# Patient Record
Sex: Female | Born: 1955 | Race: White | Hispanic: No | Smoking: Former smoker
Health system: Southern US, Community
[De-identification: ages and names within clinical notes are randomized; demographics above are authoritative.]

## PROBLEM LIST (undated history)

## (undated) DIAGNOSIS — R1011 Right upper quadrant pain: Secondary | ICD-10-CM

## (undated) DIAGNOSIS — Z923 Personal history of irradiation: Secondary | ICD-10-CM

## (undated) DIAGNOSIS — J45909 Unspecified asthma, uncomplicated: Secondary | ICD-10-CM

## (undated) DIAGNOSIS — I10 Essential (primary) hypertension: Secondary | ICD-10-CM

## (undated) DIAGNOSIS — R920 Mammographic microcalcification found on diagnostic imaging of breast: Secondary | ICD-10-CM

## (undated) DIAGNOSIS — Z853 Personal history of malignant neoplasm of breast: Secondary | ICD-10-CM

## (undated) DIAGNOSIS — E669 Obesity, unspecified: Secondary | ICD-10-CM

## (undated) DIAGNOSIS — H409 Unspecified glaucoma: Secondary | ICD-10-CM

## (undated) DIAGNOSIS — Z87891 Personal history of nicotine dependence: Secondary | ICD-10-CM

## (undated) DIAGNOSIS — M1990 Unspecified osteoarthritis, unspecified site: Secondary | ICD-10-CM

## (undated) DIAGNOSIS — Z1211 Encounter for screening for malignant neoplasm of colon: Secondary | ICD-10-CM

## (undated) DIAGNOSIS — G5600 Carpal tunnel syndrome, unspecified upper limb: Secondary | ICD-10-CM

## (undated) DIAGNOSIS — M810 Age-related osteoporosis without current pathological fracture: Secondary | ICD-10-CM

## (undated) DIAGNOSIS — E785 Hyperlipidemia, unspecified: Secondary | ICD-10-CM

## (undated) DIAGNOSIS — C801 Malignant (primary) neoplasm, unspecified: Secondary | ICD-10-CM

## (undated) DIAGNOSIS — N6019 Diffuse cystic mastopathy of unspecified breast: Secondary | ICD-10-CM

## (undated) HISTORY — DX: Carpal tunnel syndrome, unspecified upper limb: G56.00

## (undated) HISTORY — DX: Right upper quadrant pain: R10.11

## (undated) HISTORY — DX: Hyperlipidemia, unspecified: E78.5

## (undated) HISTORY — DX: Personal history of malignant neoplasm of breast: Z85.3

## (undated) HISTORY — DX: Mammographic microcalcification found on diagnostic imaging of breast: R92.0

## (undated) HISTORY — DX: Diffuse cystic mastopathy of unspecified breast: N60.19

## (undated) HISTORY — DX: Essential (primary) hypertension: I10

## (undated) HISTORY — PX: COLONOSCOPY: SHX174

## (undated) HISTORY — DX: Obesity, unspecified: E66.9

## (undated) HISTORY — DX: Unspecified osteoarthritis, unspecified site: M19.90

## (undated) HISTORY — DX: Malignant (primary) neoplasm, unspecified: C80.1

## (undated) HISTORY — DX: Age-related osteoporosis without current pathological fracture: M81.0

## (undated) HISTORY — DX: Personal history of nicotine dependence: Z87.891

## (undated) HISTORY — DX: Unspecified asthma, uncomplicated: J45.909

## (undated) HISTORY — DX: Encounter for screening for malignant neoplasm of colon: Z12.11

## (undated) HISTORY — DX: Unspecified glaucoma: H40.9

---

## 1977-03-27 HISTORY — PX: PILONIDAL CYST EXCISION: SHX744

## 2003-03-28 HISTORY — PX: BREAST CYST EXCISION: SHX579

## 2004-08-05 ENCOUNTER — Ambulatory Visit: Payer: Self-pay | Admitting: Gastroenterology

## 2006-06-05 ENCOUNTER — Ambulatory Visit: Payer: Self-pay | Admitting: General Surgery

## 2006-07-02 ENCOUNTER — Ambulatory Visit: Payer: Self-pay | Admitting: Internal Medicine

## 2006-09-24 ENCOUNTER — Ambulatory Visit: Payer: Self-pay | Admitting: Ophthalmology

## 2007-03-28 HISTORY — PX: UPPER GI ENDOSCOPY: SHX6162

## 2007-06-11 ENCOUNTER — Ambulatory Visit: Payer: Self-pay | Admitting: General Surgery

## 2008-06-04 ENCOUNTER — Ambulatory Visit: Payer: Self-pay | Admitting: Internal Medicine

## 2008-06-11 ENCOUNTER — Ambulatory Visit: Payer: Self-pay | Admitting: General Surgery

## 2009-02-26 ENCOUNTER — Ambulatory Visit: Payer: Self-pay | Admitting: Ophthalmology

## 2009-03-01 ENCOUNTER — Ambulatory Visit: Payer: Self-pay | Admitting: Ophthalmology

## 2009-06-14 ENCOUNTER — Ambulatory Visit: Payer: Self-pay | Admitting: General Surgery

## 2009-06-15 ENCOUNTER — Ambulatory Visit: Payer: Self-pay | Admitting: General Surgery

## 2009-09-30 ENCOUNTER — Ambulatory Visit: Payer: Self-pay | Admitting: Internal Medicine

## 2009-11-30 ENCOUNTER — Ambulatory Visit: Payer: Self-pay | Admitting: General Surgery

## 2010-03-27 DIAGNOSIS — G5600 Carpal tunnel syndrome, unspecified upper limb: Secondary | ICD-10-CM

## 2010-03-27 DIAGNOSIS — Z853 Personal history of malignant neoplasm of breast: Secondary | ICD-10-CM

## 2010-03-27 DIAGNOSIS — R1011 Right upper quadrant pain: Secondary | ICD-10-CM

## 2010-03-27 DIAGNOSIS — C801 Malignant (primary) neoplasm, unspecified: Secondary | ICD-10-CM

## 2010-03-27 DIAGNOSIS — R920 Mammographic microcalcification found on diagnostic imaging of breast: Secondary | ICD-10-CM

## 2010-03-27 HISTORY — DX: Personal history of malignant neoplasm of breast: Z85.3

## 2010-03-27 HISTORY — DX: Right upper quadrant pain: R10.11

## 2010-03-27 HISTORY — DX: Malignant (primary) neoplasm, unspecified: C80.1

## 2010-03-27 HISTORY — DX: Mammographic microcalcification found on diagnostic imaging of breast: R92.0

## 2010-03-27 HISTORY — DX: Carpal tunnel syndrome, unspecified upper limb: G56.00

## 2010-03-27 HISTORY — PX: BREAST BIOPSY: SHX20

## 2010-03-27 HISTORY — PX: BREAST LUMPECTOMY WITH SENTINEL LYMPH NODE BIOPSY: SHX5597

## 2010-06-17 ENCOUNTER — Ambulatory Visit: Payer: Self-pay | Admitting: General Surgery

## 2010-06-22 ENCOUNTER — Ambulatory Visit: Payer: Self-pay | Admitting: General Surgery

## 2010-07-11 ENCOUNTER — Ambulatory Visit: Payer: Self-pay | Admitting: General Surgery

## 2010-07-21 ENCOUNTER — Ambulatory Visit: Payer: Self-pay | Admitting: Anesthesiology

## 2010-07-22 ENCOUNTER — Ambulatory Visit: Payer: Self-pay | Admitting: General Surgery

## 2010-07-26 HISTORY — PX: BREAST MAMMOSITE: SHX5264

## 2010-08-01 ENCOUNTER — Ambulatory Visit: Payer: Self-pay | Admitting: General Surgery

## 2010-08-03 LAB — PATHOLOGY REPORT

## 2010-08-11 ENCOUNTER — Ambulatory Visit: Payer: Self-pay | Admitting: Radiation Oncology

## 2010-08-26 ENCOUNTER — Ambulatory Visit: Payer: Self-pay | Admitting: Radiation Oncology

## 2010-09-25 ENCOUNTER — Ambulatory Visit: Payer: Self-pay | Admitting: Radiation Oncology

## 2010-10-26 ENCOUNTER — Ambulatory Visit: Payer: Self-pay | Admitting: Radiation Oncology

## 2011-01-16 ENCOUNTER — Ambulatory Visit: Payer: Self-pay | Admitting: General Surgery

## 2011-03-06 ENCOUNTER — Ambulatory Visit: Payer: Self-pay | Admitting: Radiation Oncology

## 2011-03-28 ENCOUNTER — Ambulatory Visit: Payer: Self-pay | Admitting: Radiation Oncology

## 2011-03-28 DIAGNOSIS — E669 Obesity, unspecified: Secondary | ICD-10-CM

## 2011-03-28 DIAGNOSIS — Z1211 Encounter for screening for malignant neoplasm of colon: Secondary | ICD-10-CM

## 2011-03-28 HISTORY — DX: Encounter for screening for malignant neoplasm of colon: Z12.11

## 2011-03-28 HISTORY — PX: BREAST LUMPECTOMY: SHX2

## 2011-03-28 HISTORY — PX: LIVER BIOPSY: SHX301

## 2011-03-28 HISTORY — DX: Obesity, unspecified: E66.9

## 2011-05-03 ENCOUNTER — Ambulatory Visit: Payer: Self-pay | Admitting: Gastroenterology

## 2011-05-03 LAB — APTT: Activated PTT: 26.8 secs (ref 23.6–35.9)

## 2011-05-03 LAB — PROTIME-INR
INR: 0.9
Prothrombin Time: 12.2 secs (ref 11.5–14.7)

## 2011-05-03 LAB — PLATELET COUNT: Platelet: 212 10*3/uL (ref 150–440)

## 2011-05-12 LAB — PATHOLOGY REPORT

## 2011-07-19 ENCOUNTER — Ambulatory Visit: Payer: Self-pay | Admitting: Gastroenterology

## 2011-08-29 ENCOUNTER — Ambulatory Visit: Payer: Self-pay | Admitting: General Surgery

## 2011-09-15 ENCOUNTER — Ambulatory Visit: Payer: Self-pay | Admitting: Oncology

## 2011-09-21 ENCOUNTER — Ambulatory Visit: Payer: Self-pay | Admitting: Oncology

## 2011-09-21 LAB — CBC CANCER CENTER
Basophil #: 0 x10 3/mm (ref 0.0–0.1)
Basophil %: 0.4 %
Eosinophil %: 0 %
HCT: 26.6 % — ABNORMAL LOW (ref 35.0–47.0)
Lymphocyte #: 0.6 x10 3/mm — ABNORMAL LOW (ref 1.0–3.6)
MCH: 31.9 pg (ref 26.0–34.0)
MCV: 98 fL (ref 80–100)
Monocyte #: 0.2 x10 3/mm (ref 0.2–0.9)
Monocyte %: 11.8 %
Neutrophil #: 0.9 x10 3/mm — ABNORMAL LOW (ref 1.4–6.5)
Platelet: 170 x10 3/mm (ref 150–440)
RBC: 2.72 10*6/uL — ABNORMAL LOW (ref 3.80–5.20)
RDW: 16.1 % — ABNORMAL HIGH (ref 11.5–14.5)
WBC: 1.6 x10 3/mm — CL (ref 3.6–11.0)

## 2011-09-21 LAB — LACTATE DEHYDROGENASE: LDH: 168 U/L (ref 84–246)

## 2011-09-21 LAB — FERRITIN: Ferritin (ARMC): 406 ng/mL — ABNORMAL HIGH (ref 8–388)

## 2011-09-21 LAB — FOLATE: Folic Acid: 17.1 ng/mL (ref 3.1–100.0)

## 2011-09-21 LAB — SEDIMENTATION RATE: Erythrocyte Sed Rate: 35 mm/hr — ABNORMAL HIGH (ref 0–30)

## 2011-09-21 LAB — IRON AND TIBC
Iron Bind.Cap.(Total): 410 ug/dL (ref 250–450)
Iron: 173 ug/dL — ABNORMAL HIGH (ref 50–170)

## 2011-09-22 LAB — PROT IMMUNOELECTROPHORES(ARMC)

## 2011-09-25 ENCOUNTER — Ambulatory Visit: Payer: Self-pay | Admitting: Oncology

## 2011-10-09 LAB — CBC CANCER CENTER
Basophil #: 0 x10 3/mm (ref 0.0–0.1)
Eosinophil #: 0 x10 3/mm (ref 0.0–0.7)
HCT: 22.9 % — ABNORMAL LOW (ref 35.0–47.0)
Lymphocyte #: 0.5 x10 3/mm — ABNORMAL LOW (ref 1.0–3.6)
MCH: 32.6 pg (ref 26.0–34.0)
MCHC: 33.2 g/dL (ref 32.0–36.0)
MCV: 98 fL (ref 80–100)
Monocyte #: 0.2 x10 3/mm (ref 0.2–0.9)
Monocyte %: 10.5 %
Neutrophil #: 1 x10 3/mm — ABNORMAL LOW (ref 1.4–6.5)
Platelet: 149 x10 3/mm — ABNORMAL LOW (ref 150–440)
RDW: 17.3 % — ABNORMAL HIGH (ref 11.5–14.5)
WBC: 1.7 x10 3/mm — CL (ref 3.6–11.0)

## 2011-10-16 LAB — CBC CANCER CENTER
Basophil #: 0 x10 3/mm (ref 0.0–0.1)
Eosinophil %: 0.1 %
Lymphocyte %: 20.6 %
MCH: 31.9 pg (ref 26.0–34.0)
Monocyte %: 7.9 %
Neutrophil %: 71.1 %
Platelet: 160 x10 3/mm (ref 150–440)
RBC: 2.71 10*6/uL — ABNORMAL LOW (ref 3.80–5.20)
WBC: 2.4 x10 3/mm — ABNORMAL LOW (ref 3.6–11.0)

## 2011-10-23 LAB — CBC CANCER CENTER
Basophil #: 0 x10 3/mm (ref 0.0–0.1)
Basophil %: 0.2 %
Eosinophil #: 0 x10 3/mm (ref 0.0–0.7)
HGB: 9.1 g/dL — ABNORMAL LOW (ref 12.0–16.0)
Lymphocyte #: 0.7 x10 3/mm — ABNORMAL LOW (ref 1.0–3.6)
Lymphocyte %: 31.8 %
MCH: 33.7 pg (ref 26.0–34.0)
MCHC: 34 g/dL (ref 32.0–36.0)
Monocyte #: 0.2 x10 3/mm (ref 0.2–0.9)
Neutrophil #: 1.2 x10 3/mm — ABNORMAL LOW (ref 1.4–6.5)
RDW: 17.8 % — ABNORMAL HIGH (ref 11.5–14.5)

## 2011-10-26 ENCOUNTER — Ambulatory Visit: Payer: Self-pay | Admitting: Oncology

## 2011-10-30 LAB — CBC CANCER CENTER
Basophil #: 0 x10 3/mm (ref 0.0–0.1)
Basophil %: 0.5 %
Eosinophil #: 0 x10 3/mm (ref 0.0–0.7)
Eosinophil %: 0.1 %
HCT: 23.1 % — ABNORMAL LOW (ref 35.0–47.0)
Lymphocyte #: 0.6 x10 3/mm — ABNORMAL LOW (ref 1.0–3.6)
MCHC: 34 g/dL (ref 32.0–36.0)
MCV: 100 fL (ref 80–100)
Monocyte #: 0.3 x10 3/mm (ref 0.2–0.9)
Monocyte %: 9.9 %
Neutrophil %: 67.1 %
Platelet: 170 x10 3/mm (ref 150–440)
RDW: 17.8 % — ABNORMAL HIGH (ref 11.5–14.5)
WBC: 2.5 x10 3/mm — ABNORMAL LOW (ref 3.6–11.0)

## 2011-11-26 ENCOUNTER — Ambulatory Visit: Payer: Self-pay | Admitting: Oncology

## 2011-12-11 LAB — CBC CANCER CENTER
Basophil %: 0.9 %
Eosinophil #: 0 x10 3/mm (ref 0.0–0.7)
Eosinophil %: 0.4 %
HCT: 21.4 % — ABNORMAL LOW (ref 35.0–47.0)
HGB: 6.9 g/dL — ABNORMAL LOW (ref 12.0–16.0)
Lymphocyte #: 0.6 x10 3/mm — ABNORMAL LOW (ref 1.0–3.6)
Lymphocyte %: 32.7 %
MCH: 33.2 pg (ref 26.0–34.0)
MCHC: 32.1 g/dL (ref 32.0–36.0)
MCV: 103 fL — ABNORMAL HIGH (ref 80–100)
Monocyte #: 0.2 x10 3/mm (ref 0.2–0.9)
Neutrophil #: 1 x10 3/mm — ABNORMAL LOW (ref 1.4–6.5)
Neutrophil %: 55.6 %
Platelet: 169 x10 3/mm (ref 150–440)
RBC: 2.07 10*6/uL — ABNORMAL LOW (ref 3.80–5.20)

## 2011-12-26 ENCOUNTER — Ambulatory Visit: Payer: Self-pay | Admitting: Oncology

## 2012-03-18 ENCOUNTER — Ambulatory Visit: Payer: Self-pay | Admitting: General Surgery

## 2012-03-25 IMAGING — CT CT SIM MISC
1 series · 16 of 32 positions shown, 20 images · non-contrast
Comparison: none

[Series 2: — · axial · 1.17mm/px · z∈[-672,-492]mm · 16 of 66 slices shown, 20 images]
[im 3/66  mediastinal]
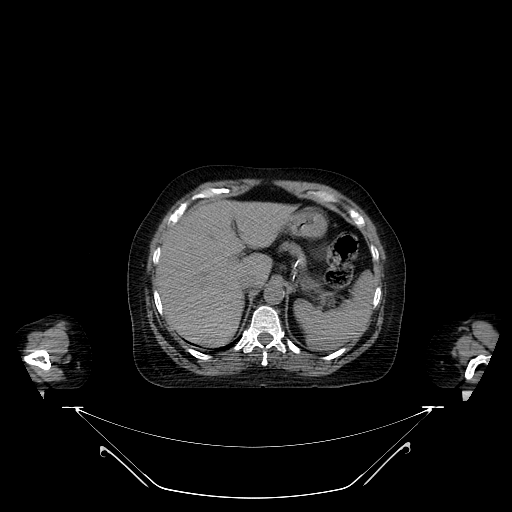
[im 3/66  lung]
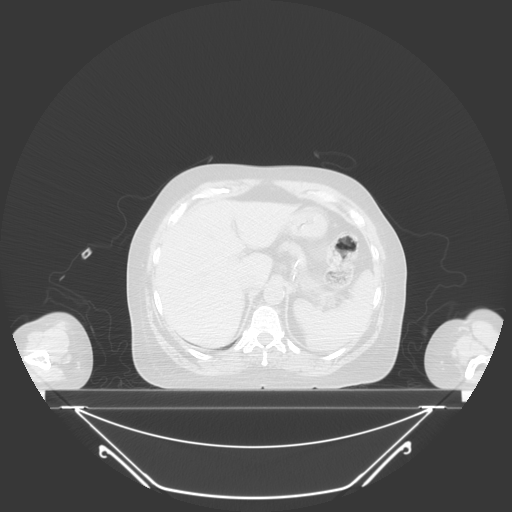
[im 8/66  lung]
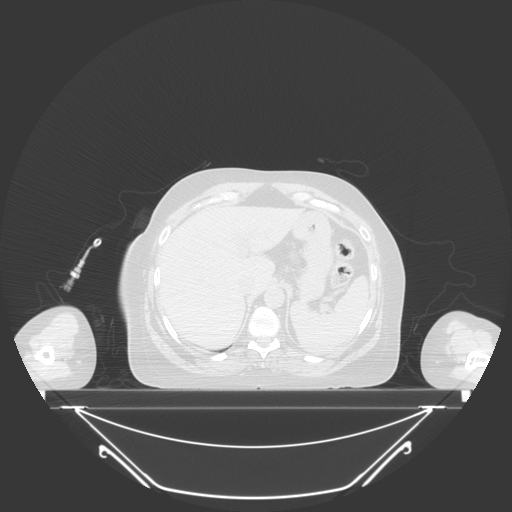
[im 13/66  lung]
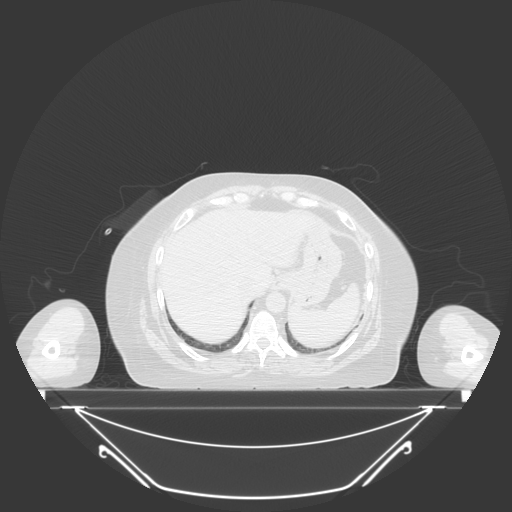
[im 15/66  lung]
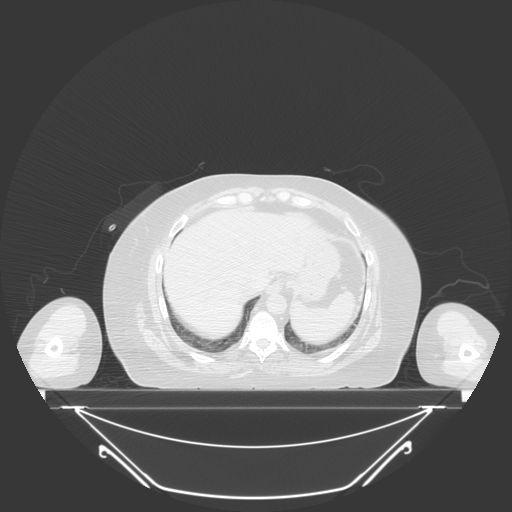
[im 20/66  mediastinal]
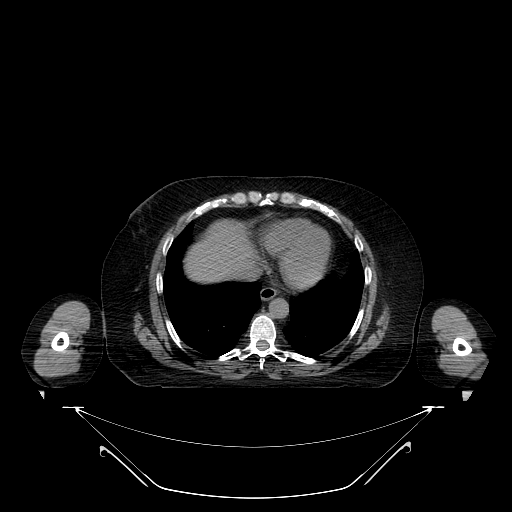
[im 20/66  lung]
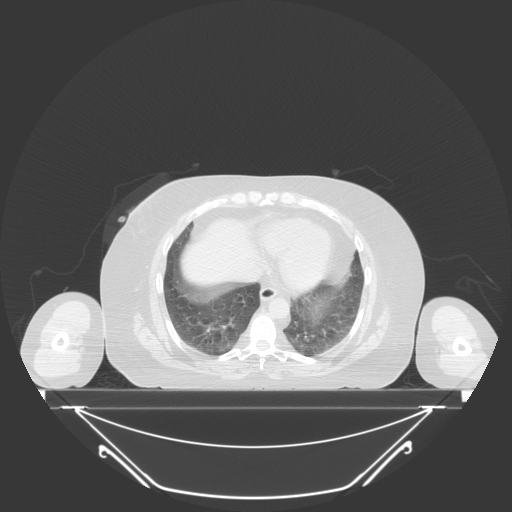
[im 25/66  lung]
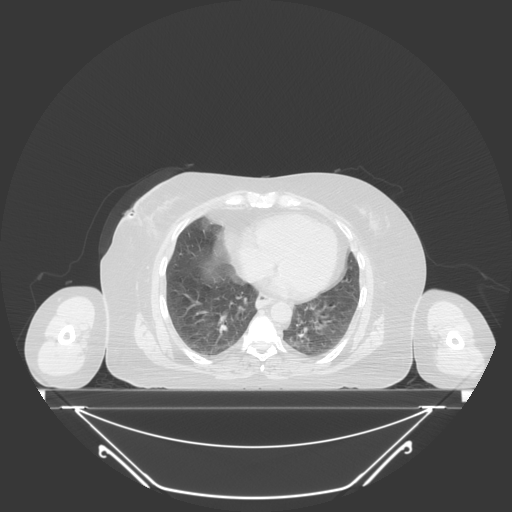
[im 29/66  lung]
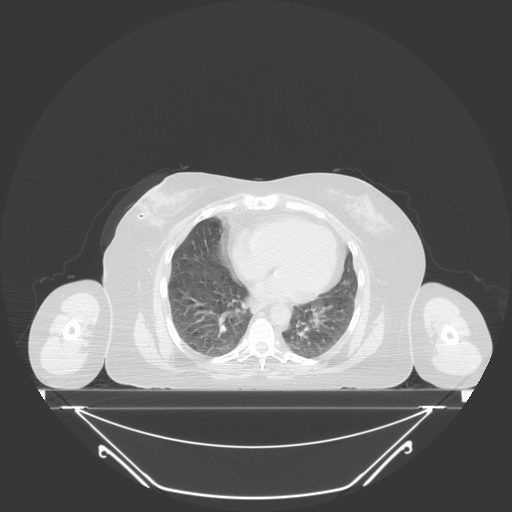
[im 32/66  lung]
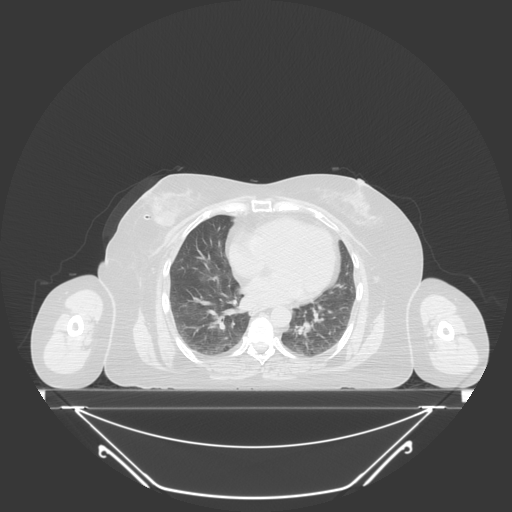
[im 34/66  mediastinal]
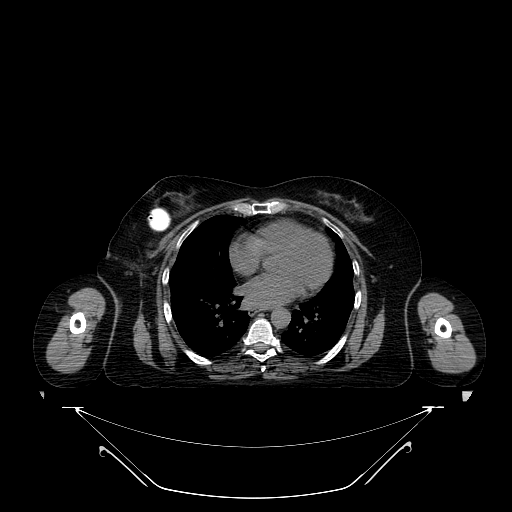
[im 34/66  lung]
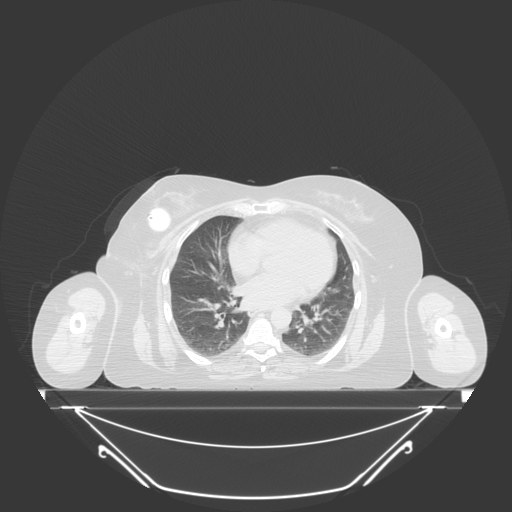
[im 37/66  lung]
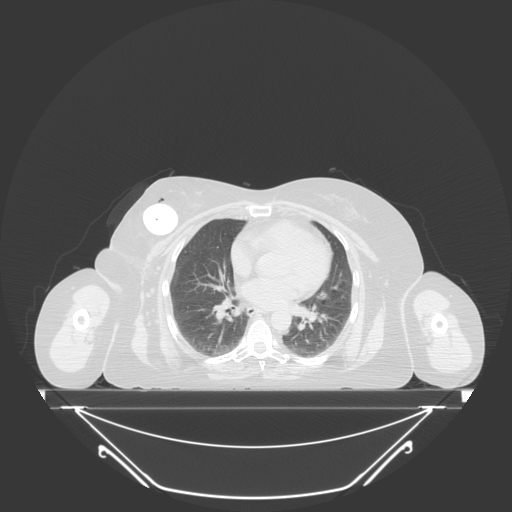
[im 40/66  lung]
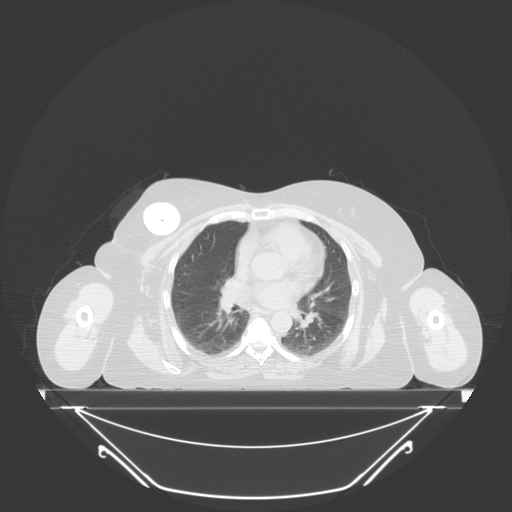
[im 44/66  lung]
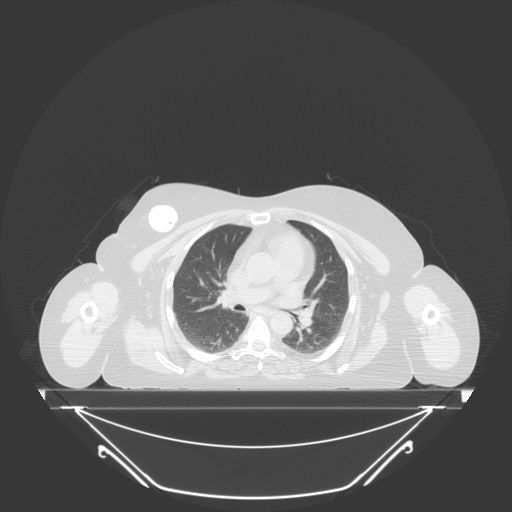
[im 49/66  mediastinal]
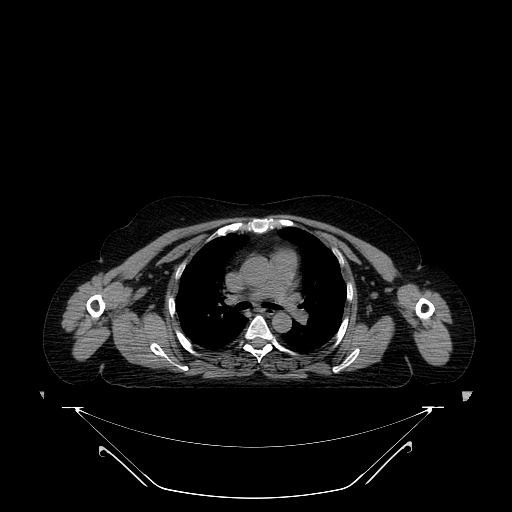
[im 49/66  lung]
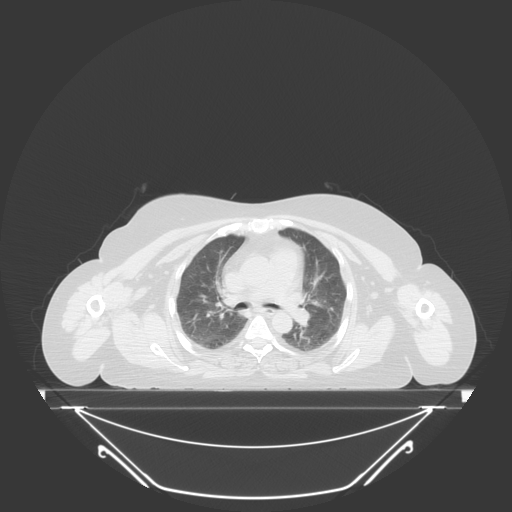
[im 53/66  lung]
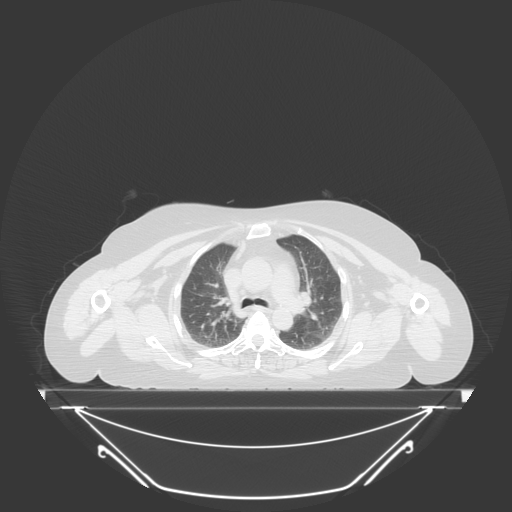
[im 58/66  lung]
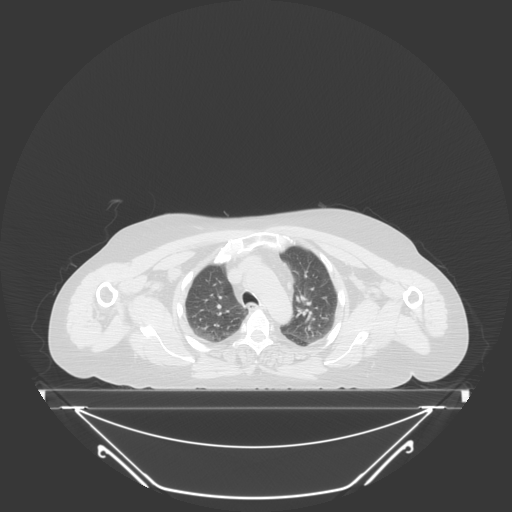
[im 63/66  lung]
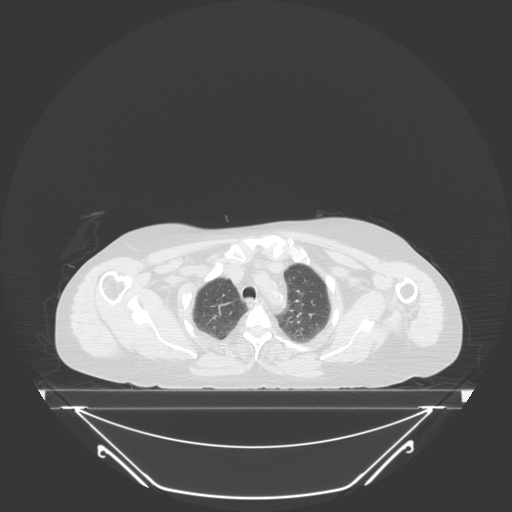

[16 of 32 positions shown; findings below may reference images not displayed]

IMAGES IMPORTED FROM THE [employer] WORKFLOW SYSTEM
NO DICTATION FOR STUDY

## 2012-08-06 ENCOUNTER — Encounter: Payer: Self-pay | Admitting: *Deleted

## 2012-08-06 DIAGNOSIS — D0590 Unspecified type of carcinoma in situ of unspecified breast: Secondary | ICD-10-CM | POA: Insufficient documentation

## 2012-08-06 DIAGNOSIS — Z853 Personal history of malignant neoplasm of breast: Secondary | ICD-10-CM | POA: Insufficient documentation

## 2012-10-01 ENCOUNTER — Ambulatory Visit: Payer: Self-pay | Admitting: General Surgery

## 2012-10-07 ENCOUNTER — Encounter: Payer: Self-pay | Admitting: General Surgery

## 2012-10-10 ENCOUNTER — Ambulatory Visit: Payer: Self-pay | Admitting: General Surgery

## 2012-10-16 ENCOUNTER — Ambulatory Visit: Payer: Self-pay | Admitting: General Surgery

## 2012-10-16 ENCOUNTER — Encounter: Payer: Self-pay | Admitting: General Surgery

## 2012-10-16 ENCOUNTER — Ambulatory Visit (INDEPENDENT_AMBULATORY_CARE_PROVIDER_SITE_OTHER): Payer: BC Managed Care – PPO | Admitting: General Surgery

## 2012-10-16 VITALS — BP 130/70 | HR 82 | Resp 16 | Ht 64.0 in | Wt 191.0 lb

## 2012-10-16 DIAGNOSIS — Z853 Personal history of malignant neoplasm of breast: Secondary | ICD-10-CM

## 2012-10-16 MED ORDER — METHYLPREDNISOLONE (PAK) 4 MG PO TABS: ORAL_TABLET | ORAL | Status: DC

## 2012-10-16 NOTE — Patient Instructions (Addendum)
Prednisone dose pack-pt requests for her joint pains till she sees her rheumatologist 1 year mammogram and office visit

## 2012-10-16 NOTE — Progress Notes (Signed)
Patient ID: Erica Tucker. Males, female   DOB: 10/24/1955, 57 y.o.   MRN: [id number]  Chief Complaint  Patient presents with  . Follow-up    mammogram    HPI Erica Tucker. [name] is a 57 y.o. 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 The most recent mammogram was done on 10/01/12 with a birad category 2. No new breast issues. Tolerating anastrozole.  HPI  Past Medical History  Diagnosis Date  . Carpal tunnel syndrome 2012  . Hypertension   . Personal history of tobacco use, presenting hazards to health   . Asthma   . Diffuse cystic mastopathy   . 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 Partial mastectomy & radiation. Currently on anastrozole  . Abdominal pain, right upper quadrant 2012  . Mammographic microcalcification 2012  . Special screening for malignant neoplasms, colon 2013  . Obesity, unspecified 2013  . Cancer     right breast  . Hyperlipidemia   . Glaucoma   . Arthritis     Past Surgical History  Procedure Laterality Date  . Colonoscopy  6578,4696    Dr. Henrene Hawking, [facility]; Dr Sharlene Dory  . Upper gi endoscopy  2009  . Breast cyst excision  2005  . Pilonidal cyst excision  1979  . Breast mammosite Right May 2012  . Breast lumpectomy with sentinel lymph node biopsy Right 2012  . Liver biopsy  2013    History reviewed. No pertinent family history.  Social History History  Substance Use Topics  . Smoking status: Former Smoker -- 1.00 packs/day for 20 years    Types: Cigarettes  . Smokeless tobacco: Not on file  . Alcohol Use: No     Comment: quit    Allergies  Allergen Reactions  . Sulfa Antibiotics Other (See Comments)    Reaction listed as unknown    Current Outpatient Prescriptions  Medication Sig Dispense Refill  . acetaminophen (TYLENOL) 325 MG tablet Take 650 mg by mouth every 6 (six) hours as needed for pain.      Marland Kitchen  albuterol (PROVENTIL HFA;VENTOLIN HFA) 108 (90 BASE) MCG/ACT inhaler Inhale 2 puffs into the lungs every 6 (six) hours as needed for wheezing.      Marland Kitchen amitriptyline (ELAVIL) 50 MG tablet Take 50 mg by mouth at bedtime.      Marland Kitchen anastrozole (ARIMIDEX) 1 MG tablet Take 1 mg by mouth daily.      . clonazePAM (KLONOPIN) 0.5 MG tablet Take 2 mg by mouth 2 (two) times daily as needed for anxiety.      Marland Kitchen escitalopram (LEXAPRO) 20 MG tablet Take 20 mg by mouth daily.      . famciclovir (FAMVIR) 500 MG tablet Take 500 mg by mouth as directed.      Marland Kitchen omeprazole (PRILOSEC) 20 MG capsule Take 1 capsule by mouth 2 (two) times daily.      . SYMBICORT 160-4.5 MCG/ACT inhaler Inhale 2 puffs into the lungs 2 (two) times daily.      . traMADol (ULTRAM) 50 MG tablet Take 1 tablet by mouth daily.      . methylPREDNIsolone (MEDROL DOSPACK) 4 MG tablet follow package directions  21 tablet  0   No current facility-administered medications for this visit.    Review of Systems Review of Systems  Constitutional: Negative.   Respiratory: Positive for shortness of  breath.   Cardiovascular: Negative.     Blood pressure 130/70, pulse 82, resp. rate 16, height 5\' 4"  (1.626 m), weight 191 lb (86.637 kg).  Physical Exam Physical Exam  Constitutional: She is oriented to person, place, and time. She appears well-developed and well-nourished.  Eyes: Conjunctivae are normal.  Neck: Normal range of motion. Neck supple.  Cardiovascular: Normal rate and regular rhythm.   Pulses:      Dorsalis pedis pulses are 2+ on the right side, and 2+ on the left side.       Posterior tibial pulses are 2+ on the right side, and 2+ on the left side.  No lower extremity edema  Pulmonary/Chest: Effort normal and breath sounds normal. Right breast exhibits no inverted nipple, no mass, no nipple discharge and no tenderness. Left breast exhibits no inverted nipple, no mass, no nipple discharge, no skin change and no tenderness.   Lymphadenopathy:    She has no cervical adenopathy.    She has no axillary adenopathy.  Neurological: She is alert and oriented to person, place, and time.  Skin: Skin is warm and dry.  right breast lumpectomy site with associated scarring from surgery and radiation  Data Reviewed Mammogram reviewed  Assessment    stable exam    Plan     One year Bilateral Diagnostic mammogram       Kylin Dubs G 10/16/2012, 6:55 PM

## 2012-10-17 ENCOUNTER — Encounter: Payer: Self-pay | Admitting: *Deleted

## 2012-11-06 ENCOUNTER — Ambulatory Visit: Payer: Self-pay | Admitting: Rheumatology

## 2012-11-11 ENCOUNTER — Ambulatory Visit: Payer: Self-pay | Admitting: General Surgery

## 2013-02-25 ENCOUNTER — Encounter: Payer: Self-pay | Admitting: General Surgery

## 2013-02-25 ENCOUNTER — Ambulatory Visit (INDEPENDENT_AMBULATORY_CARE_PROVIDER_SITE_OTHER): Payer: BC Managed Care – PPO | Admitting: General Surgery

## 2013-02-25 VITALS — BP 132/72 | HR 82 | Resp 18 | Ht 64.0 in | Wt 198.0 lb

## 2013-02-25 DIAGNOSIS — D179 Benign lipomatous neoplasm, unspecified: Secondary | ICD-10-CM | POA: Insufficient documentation

## 2013-02-25 NOTE — Progress Notes (Signed)
Patient ID: Erica Tucker. Chancy, female   DOB: 1955-08-24, 57 y.o.   MRN: [id number]  Chief Complaint  Patient presents with  . Follow-up    right palpable mass in upper leg    HPI Erica Tucker. Gutt is a 57 y.o. female who presents for an evaluation of a right upper leg mass. The patient states the mass has been there for approximately 3-4 years. It started causing her pain in October 2014. Standing at work for long periods of time makes the pain worse.   HPI  Past Medical History  Diagnosis Date  . Carpal tunnel syndrome 2012  . Hypertension   . Personal history of tobacco use, presenting hazards to health   . Asthma   . Diffuse cystic mastopathy   . 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 Partial mastectomy & radiation. Currently on anastrozole  . Abdominal pain, right upper quadrant 2012  . Mammographic microcalcification 2012  . Special screening for malignant neoplasms, colon 2013  . Obesity, unspecified 2013  . Cancer     right breast  . Hyperlipidemia   . Glaucoma   . Arthritis   . Osteoporosis     Past Surgical History  Procedure Laterality Date  . Colonoscopy  7829,5621    Dr. Henrene Hawking, [facility]; Dr Sharlene Dory  . Upper gi endoscopy  2009  . Breast cyst excision  2005  . Pilonidal cyst excision  1979  . Breast mammosite Right May 2012  . Breast lumpectomy with sentinel lymph node biopsy Right 2012  . Liver biopsy  2013    History reviewed. No pertinent family history.  Social History History  Substance Use Topics  . Smoking status: Former Smoker -- 1.00 packs/day for 20 years    Types: Cigarettes  . Smokeless tobacco: Not on file  . Alcohol Use: No     Comment: quit    Allergies  Allergen Reactions  . Epinephrine Other (See Comments)    Chest pain  . Sulfa Antibiotics Other (See Comments)    Reaction listed as unknown    Current Outpatient Prescriptions  Medication Sig Dispense  Refill  . acetaminophen (TYLENOL) 325 MG tablet Take 650 mg by mouth every 6 (six) hours as needed for pain.      Marland Kitchen albuterol (PROVENTIL HFA;VENTOLIN HFA) 108 (90 BASE) MCG/ACT inhaler Inhale 2 puffs into the lungs every 6 (six) hours as needed for wheezing.      Marland Kitchen amitriptyline (ELAVIL) 50 MG tablet Take 50 mg by mouth at bedtime.      Marland Kitchen anastrozole (ARIMIDEX) 1 MG tablet Take 1 mg by mouth daily.      . bisoprolol-hydrochlorothiazide (ZIAC) 2.5-6.25 MG per tablet Take 1 tablet by mouth daily.      . clonazePAM (KLONOPIN) 0.5 MG tablet Take 0.5 mg by mouth 2 (two) times daily as needed for anxiety.       Marland Kitchen escitalopram (LEXAPRO) 20 MG tablet Take 20 mg by mouth daily.      . famciclovir (FAMVIR) 500 MG tablet Take 500 mg by mouth 2 (two) times daily.       . fluticasone (FLONASE) 50 MCG/ACT nasal spray Place 2 sprays into both nostrils daily.      Marland Kitchen ibandronate (BONIVA) 150 MG tablet Take 1 tablet by mouth every 30 (thirty) days.      Marland Kitchen omeprazole (PRILOSEC) 20 MG capsule Take 1 capsule by mouth  2 (two) times daily.      . SYMBICORT 160-4.5 MCG/ACT inhaler Inhale 2 puffs into the lungs 2 (two) times daily.      . traMADol (ULTRAM) 50 MG tablet Take 1 tablet by mouth daily.       No current facility-administered medications for this visit.    Review of Systems Review of Systems  Constitutional: Negative.   Respiratory: Negative.   Cardiovascular: Negative.     Blood pressure 132/72, pulse 82, resp. rate 18, height 5\' 4"  (1.626 m), weight 198 lb (89.812 kg).  Physical Exam Physical Exam  Constitutional: She is oriented to person, place, and time. She appears well-developed and well-nourished.  Neurological: She is alert and oriented to person, place, and time.  Skin: Skin is warm and dry.  Patient has a 1.5 cm lipoma anterior right thigh at junction of middle and lower third. It is non tender. Patient complains of numbness in the more lateral aspect of thigh and is likely not related  to lipoma.    Data Reviewed None  Assessment    Lipoma on thigh. Does not warrant excision. Discussed fully with patient.     Plan    Follow up next July as scheduled for her breast cancer.        Erica Tucker 02/25/2013, 9:44 AM

## 2013-02-25 NOTE — Patient Instructions (Addendum)
Follow up next July as scheduled for her breast cancer. Patient to continue self breast checks. She is advised to contact our office with any new questions or concerns.

## 2013-10-21 ENCOUNTER — Encounter: Payer: Self-pay | Admitting: General Surgery

## 2013-11-04 ENCOUNTER — Encounter: Payer: Self-pay | Admitting: General Surgery

## 2013-11-04 ENCOUNTER — Ambulatory Visit (INDEPENDENT_AMBULATORY_CARE_PROVIDER_SITE_OTHER): Payer: 59 | Admitting: General Surgery

## 2013-11-04 VITALS — BP 130/71 | HR 72 | Resp 14 | Ht 64.0 in | Wt 205.0 lb

## 2013-11-04 DIAGNOSIS — Z853 Personal history of malignant neoplasm of breast: Secondary | ICD-10-CM

## 2013-11-04 NOTE — Patient Instructions (Signed)
Patient to return in 1 year with bilateral diagnostic mammogram. Continue self breast exams. Call office for any new breast issues or concerns.  

## 2013-11-04 NOTE — Progress Notes (Signed)
Patient ID: Erica Tucker. Erica Tucker, female   DOB: Nov 21, 1955, 58 y.o.   MRN: [id number]  Chief Complaint  Patient presents with  . Follow-up    mammogram    HPI Erica Tucker. Vary is a 58 y.o. female who presents for  breast cancer follow up. The most recent mammogram was done on 10/21/13. Patient does perform regular self breast checks and gets regular mammograms done. No new problems at this time.     HPI  Past Medical History  Diagnosis Date  . Carpal tunnel syndrome 2012  . Hypertension   . Personal history of tobacco use, presenting hazards to health   . Asthma   . Diffuse cystic mastopathy   . 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 Partial mastectomy & radiation. Currently on anastrozole  . Abdominal pain, right upper quadrant 2012  . Mammographic microcalcification 2012  . Special screening for malignant neoplasms, colon 2013  . Obesity, unspecified 2013  . Cancer     right breast  . Hyperlipidemia   . Glaucoma   . Arthritis   . Osteoporosis     Past Surgical History  Procedure Laterality Date  . Colonoscopy  6294,7654    Dr. Sonny Masters, [facility]; Dr Lacie Scotts  . Upper gi endoscopy  2009  . Breast cyst excision  2005  . Pilonidal cyst excision  1979  . Breast mammosite Right May 2012  . Breast lumpectomy with sentinel lymph node biopsy Right 2012  . Liver biopsy  2013    History reviewed. No pertinent family history.  Social History History  Substance Use Topics  . Smoking status: Former Smoker -- 1.00 packs/day for 20 years    Types: Cigarettes  . Smokeless tobacco: Not on file  . Alcohol Use: No     Comment: quit    Allergies  Allergen Reactions  . Epinephrine Other (See Comments)    Chest pain  . Sulfa Antibiotics Other (See Comments)    Reaction listed as unknown    Current Outpatient Prescriptions  Medication Sig Dispense Refill  . acetaminophen (TYLENOL) 325 MG tablet Take  650 mg by mouth every 6 (six) hours as needed for pain.      Marland Kitchen albuterol (PROVENTIL HFA;VENTOLIN HFA) 108 (90 BASE) MCG/ACT inhaler Inhale 2 puffs into the lungs every 6 (six) hours as needed for wheezing.      Marland Kitchen alendronate (FOSAMAX) 70 MG tablet Take 70 mg by mouth once a week. Take with a full glass of water on an empty stomach.      Marland Kitchen amitriptyline (ELAVIL) 50 MG tablet Take 50 mg by mouth at bedtime.      Marland Kitchen anastrozole (ARIMIDEX) 1 MG tablet Take 1 mg by mouth daily.      . bisoprolol-hydrochlorothiazide (ZIAC) 2.5-6.25 MG per tablet Take 1 tablet by mouth daily.      . clonazePAM (KLONOPIN) 0.5 MG tablet Take 0.5 mg by mouth 2 (two) times daily as needed for anxiety.       Marland Kitchen escitalopram (LEXAPRO) 20 MG tablet Take 20 mg by mouth daily.      . fluticasone (FLONASE) 50 MCG/ACT nasal spray Place 2 sprays into both nostrils daily.      . Fluticasone-Salmeterol (ADVAIR HFA IN) Inhale into the lungs.      Marland Kitchen omeprazole (PRILOSEC) 20 MG capsule Take 1 capsule by mouth 2 (two) times daily.      Marland Kitchen  traMADol (ULTRAM) 50 MG tablet Take 1 tablet by mouth daily.       No current facility-administered medications for this visit.    Review of Systems Review of Systems  Constitutional: Negative.   Respiratory: Negative.   Cardiovascular: Negative.     Blood pressure 130/71, pulse 72, resp. rate 14, height 5\' 4"  (1.626 m), weight 205 lb (92.987 kg).  Physical Exam Physical Exam  Constitutional: She is oriented to person, place, and time. She appears well-developed and well-nourished.  Eyes: Conjunctivae are normal. No scleral icterus.  Neck: Neck supple. No thyromegaly present.  Cardiovascular: Normal rate, regular rhythm and normal heart sounds.   No murmur heard. Pulmonary/Chest: Effort normal and breath sounds normal. Right breast exhibits no inverted nipple, no mass, no nipple discharge, no skin change and no tenderness. Left breast exhibits no inverted nipple, no mass, no nipple discharge,  no skin change and no tenderness.  Mild firm to hard tissue at lumpectomy site on right breast upper outer quadrant unchanged from before.    Abdominal: Soft. Normal appearance and bowel sounds are normal. There is no hepatosplenomegaly. There is no tenderness. No hernia.  Lymphadenopathy:    She has no cervical adenopathy.    She has no axillary adenopathy.  Neurological: She is alert and oriented to person, place, and time.  Skin: Skin is warm and dry.    Data Reviewed Mammogram reviewed and stable.   Assessment    Exam stable. 3 yrs post right breast lumpetomy, SN, radiation for dcis with tiny focus invasive CA. Now on Anastrazole and doing well.    Plan    Patient to return in 1 year with bilateral diagnostic mammogram.        [name] G 11/04/2013, 9:03 PM

## 2014-01-26 ENCOUNTER — Encounter: Payer: Self-pay | Admitting: General Surgery

## 2014-09-29 ENCOUNTER — Emergency Department
Admission: EM | Admit: 2014-09-29 | Discharge: 2014-09-29 | Disposition: A | Discharge status: Home / Self Care | Payer: 59 | Attending: Emergency Medicine | Admitting: Emergency Medicine

## 2014-09-29 ENCOUNTER — Emergency Department: Payer: 59

## 2014-09-29 ENCOUNTER — Encounter: Payer: Self-pay | Admitting: Emergency Medicine

## 2014-09-29 DIAGNOSIS — Y998 Other external cause status: Secondary | ICD-10-CM | POA: Diagnosis not present

## 2014-09-29 DIAGNOSIS — S01111A Laceration without foreign body of right eyelid and periocular area, initial encounter: Secondary | ICD-10-CM | POA: Insufficient documentation

## 2014-09-29 DIAGNOSIS — Y9289 Other specified places as the place of occurrence of the external cause: Secondary | ICD-10-CM | POA: Diagnosis not present

## 2014-09-29 DIAGNOSIS — Z87891 Personal history of nicotine dependence: Secondary | ICD-10-CM | POA: Insufficient documentation

## 2014-09-29 DIAGNOSIS — I10 Essential (primary) hypertension: Secondary | ICD-10-CM | POA: Insufficient documentation

## 2014-09-29 DIAGNOSIS — S52612A Displaced fracture of left ulna styloid process, initial encounter for closed fracture: Secondary | ICD-10-CM

## 2014-09-29 DIAGNOSIS — Z79899 Other long term (current) drug therapy: Secondary | ICD-10-CM | POA: Diagnosis not present

## 2014-09-29 DIAGNOSIS — S80211A Abrasion, right knee, initial encounter: Secondary | ICD-10-CM | POA: Insufficient documentation

## 2014-09-29 DIAGNOSIS — S0181XA Laceration without foreign body of other part of head, initial encounter: Secondary | ICD-10-CM

## 2014-09-29 DIAGNOSIS — W01198A Fall on same level from slipping, tripping and stumbling with subsequent striking against other object, initial encounter: Secondary | ICD-10-CM | POA: Diagnosis not present

## 2014-09-29 DIAGNOSIS — S8012XA Contusion of left lower leg, initial encounter: Secondary | ICD-10-CM | POA: Insufficient documentation

## 2014-09-29 DIAGNOSIS — Y9389 Activity, other specified: Secondary | ICD-10-CM | POA: Diagnosis not present

## 2014-09-29 DIAGNOSIS — S8002XA Contusion of left knee, initial encounter: Secondary | ICD-10-CM

## 2014-09-29 DIAGNOSIS — S0990XA Unspecified injury of head, initial encounter: Secondary | ICD-10-CM | POA: Diagnosis present

## 2014-09-29 DIAGNOSIS — S0083XA Contusion of other part of head, initial encounter: Secondary | ICD-10-CM

## 2014-09-29 DIAGNOSIS — S52125A Nondisplaced fracture of head of left radius, initial encounter for closed fracture: Secondary | ICD-10-CM | POA: Diagnosis not present

## 2014-09-29 DIAGNOSIS — S60221A Contusion of right hand, initial encounter: Secondary | ICD-10-CM | POA: Diagnosis not present

## 2014-09-29 DIAGNOSIS — Z7952 Long term (current) use of systemic steroids: Secondary | ICD-10-CM | POA: Insufficient documentation

## 2014-09-29 DIAGNOSIS — S52122A Displaced fracture of head of left radius, initial encounter for closed fracture: Secondary | ICD-10-CM

## 2014-09-29 MED ORDER — TRAMADOL HCL 50 MG PO TABS
ORAL_TABLET | ORAL | Status: AC
  Administered 2014-09-29: 50 mg via ORAL
  Filled 2014-09-29: qty 1

## 2014-09-29 MED ORDER — HYDROCODONE-ACETAMINOPHEN 5-325 MG PO TABS: 1.0000 | ORAL_TABLET | ORAL | Status: DC | PRN

## 2014-09-29 MED ORDER — TRAMADOL HCL 50 MG PO TABS
50.0000 mg | ORAL_TABLET | Freq: Once | ORAL | Status: AC
  Administered 2014-09-29: 50 mg via ORAL

## 2014-09-29 MED ORDER — LIDOCAINE HCL (PF) 1 % IJ SOLN
INTRAMUSCULAR | Status: AC
  Administered 2014-09-29: 5 mL via INTRADERMAL
  Filled 2014-09-29: qty 5

## 2014-09-29 MED ORDER — LIDOCAINE HCL (PF) 1 % IJ SOLN
5.0000 mL | Freq: Once | INTRAMUSCULAR | Status: AC
  Administered 2014-09-29: 5 mL via INTRADERMAL

## 2014-09-29 NOTE — ED Notes (Signed)
Pt to ed with c/o fall this am, states she tripped on a root and fell hitting head cement.  Pt also c/o pain to left elbow and left knee.

## 2014-09-29 NOTE — ED Provider Notes (Signed)
[facility] Emergency Department Provider Note  ____________________________________________  Time seen: Approximately 10:24 AM  I have reviewed the triage vital signs and the nursing notes.   HISTORY  Chief Complaint Head Injury  HPI Erica Tucker is a 59 y.o. female who presents to the emergency department for a laceration to her forehead, pain to the left forearm and left knee, and pain in the right hand due to mechanical fall. She states that she may have "blacked out for a few seconds" she states that she probably tripped over a root or something on the sidewalk.   Past Medical History  Diagnosis Date  . Carpal tunnel syndrome 2012  . Hypertension   . Personal history of tobacco use, presenting hazards to health   . Asthma   . Diffuse cystic mastopathy   . 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 Partial mastectomy & radiation. Currently on anastrozole  . Abdominal pain, right upper quadrant 2012  . Mammographic microcalcification 2012  . Special screening for malignant neoplasms, colon 2013  . Obesity, unspecified 2013  . Cancer     right breast  . Hyperlipidemia   . Glaucoma   . Arthritis   . Osteoporosis     Patient Active Problem List   Diagnosis Date Noted  . Lipoma 02/25/2013  . Personal history of malignant neoplasm of breast   . Carcinoma in situ of breast     Past Surgical History  Procedure Laterality Date  . Colonoscopy  0092,3300    Dr. Sonny Masters, [facility]; Dr Lacie Scotts  . Upper gi endoscopy  2009  . Breast cyst excision  2005  . Pilonidal cyst excision  1979  . Breast mammosite Right May 2012  . Breast lumpectomy with sentinel lymph node biopsy Right 2012  . Liver biopsy  2013    Current Outpatient Rx  Name  Route  Sig  Dispense  Refill  . acetaminophen (TYLENOL) 325 MG tablet   Oral   Take 650 mg by mouth every 6 (six) hours as needed  for pain.         Marland Kitchen albuterol (PROVENTIL HFA;VENTOLIN HFA) 108 (90 BASE) MCG/ACT inhaler   Inhalation   Inhale 2 puffs into the lungs every 6 (six) hours as needed for wheezing.         Marland Kitchen alendronate (FOSAMAX) 70 MG tablet   Oral   Take 70 mg by mouth once a week. Take with a full glass of water on an empty stomach.         Marland Kitchen amitriptyline (ELAVIL) 50 MG tablet   Oral   Take 50 mg by mouth at bedtime.         Marland Kitchen anastrozole (ARIMIDEX) 1 MG tablet   Oral   Take 1 mg by mouth daily.         . bisoprolol-hydrochlorothiazide (ZIAC) 2.5-6.25 MG per tablet   Oral   Take 1 tablet by mouth daily.         . clonazePAM (KLONOPIN) 0.5 MG tablet   Oral   Take 0.5 mg by mouth 2 (two) times daily as needed for anxiety.          Marland Kitchen escitalopram (LEXAPRO) 20 MG tablet   Oral   Take 20 mg by mouth daily.         . fluticasone (FLONASE) 50 MCG/ACT nasal spray   Each Nare  Place 2 sprays into both nostrils daily.         . Fluticasone-Salmeterol (ADVAIR HFA IN)   Inhalation   Inhale into the lungs.         Marland Kitchen HYDROcodone-acetaminophen (NORCO/VICODIN) 5-325 MG per tablet   Oral   Take 1 tablet by mouth every 4 (four) hours as needed for moderate pain.   12 tablet   0   . omeprazole (PRILOSEC) 20 MG capsule   Oral   Take 1 capsule by mouth 2 (two) times daily.         . traMADol (ULTRAM) 50 MG tablet   Oral   Take 1 tablet by mouth daily.           Allergies Epinephrine and Sulfa antibiotics  No family history on file.  Social History History  Substance Use Topics  . Smoking status: Former Smoker -- 1.00 packs/day for 20 years    Types: Cigarettes  . Smokeless tobacco: Not on file  . Alcohol Use: No     Comment: quit    Review of Systems Constitutional: No fever/chills Eyes: No visual changes. ENT: No sore throat. Cardiovascular: Denies chest pain. Respiratory: Denies shortness of breath. Gastrointestinal: No abdominal pain.  No nausea, no  vomiting.  No diarrhea.  No constipation. Genitourinary: Negative for dysuria. Musculoskeletal: Pain in left arm, left knee, right hand. Skin: Negative for rash. Abrasions to face, right hand, and bilateral knees. Laceration over right eyebrow. Neurological: Negative for headaches, focal weakness or numbness.  10-point ROS otherwise negative.  ____________________________________________   PHYSICAL EXAM:  VITAL SIGNS: ED Triage Vitals  Enc Vitals Group     BP 09/29/14 0937 123/73 mmHg     Pulse Rate 09/29/14 0937 84     Resp 09/29/14 0937 18     Temp 09/29/14 0937 98.1 F (36.7 C)     Temp Source 09/29/14 0937 Oral     SpO2 09/29/14 0937 98 %     Weight 09/29/14 0937 200 lb (90.719 kg)     Height 09/29/14 0937 5\' 4"  (1.626 m)     Head Cir --      Peak Flow --      Pain Score 09/29/14 0940 5     Pain Loc --      Pain Edu? --      Excl. in [place]? --     Constitutional: Alert and oriented. Well appearing and in no acute distress. Eyes: Conjunctivae are normal. PERRL. EOMI. pupils 3 mm Head: Atraumatic. Nose: No congestion/rhinnorhea. Mouth/Throat: Mucous membranes are moist.  Oropharynx non-erythematous. Neck: Nexus criteria negative.   Cardiovascular: Normal rate, regular rhythm. Grossly normal heart sounds.  Good peripheral circulation. Respiratory: Normal respiratory effort.  No retractions. Lungs CTAB. Gastrointestinal: Soft and nontender. No distention. No abdominal bruits. No CVA tenderness. Musculoskeletal: Tenderness to palpation over both proximal and distal radius and ulna on the left, pain with rotation of wrist; left knee tender to palpation with full range of motion possible; no bony tenderness of the right hand appreciated on exam. Neurologic:  Normal speech and language. No gross focal neurologic deficits are appreciated. Speech is normal. No gait instability. Skin:  Abrasion to the right cheek/face with approximately 4 cm laceration just above the right eyebrow;  abrasions present to right hand and bilateral knees. Psychiatric: Mood and affect are normal. Speech and behavior are normal.  ____________________________________________   LABS (all labs ordered are listed, but only abnormal results are displayed)  Labs Reviewed -  No data to display ____________________________________________  EKG   ____________________________________________  RADIOLOGY  Head CT negative for acute pathology. Left forearm: Ulnar styloid avulsion fracture of indeterminate age Left elbow: Nondisplaced proximal radial head fracture  ____________________________________________   PROCEDURES  Procedure(s) performed: LACERATION REPAIR Performed by: Sherrie George Authorized by: Sherrie George Consent: Verbal consent obtained. Risks and benefits: risks, benefits and alternatives were discussed Consent given by: patient Patient identity confirmed: provided demographic data Prepped and Draped in normal sterile fashion Wound explored  Laceration Location: Over right eyebrow  Laceration Length:4cm  No Foreign Bodies seen or palpated  Anesthesia: local infiltration  Local anesthetic: lidocaine1% without epinephrine  Anesthetic total: 3 ml  Irrigation method: syringe Amount of cleaning: standard  Skin closure:simple interrupted  Number of sutures: 9  Technique: simple interrupted  Patient tolerance: Patient tolerated the procedure well with no immediate complications.   ______________________________________________________________  Sling applied to left arm by ER tech. Neurovascularly intact post application.  Critical Care performed: No  ____________________________________________   INITIAL IMPRESSION / ASSESSMENT AND PLAN / ED COURSE  Pertinent labs & imaging results that were available during my care of the patient were reviewed by me and considered in my medical decision making (see chart for details).  Patient was advised to return  to the ER in 5 days for suture removal. She was advised to wear the sling and follow up with orthopedics. She was advised to return to the ER for symptoms of concern if she is unable to see primary care or the specialist. ____________________________________________   FINAL CLINICAL IMPRESSION(S) / ED DIAGNOSES  Final diagnoses:  Laceration of face, initial encounter  Radial head fracture, left, closed, initial encounter  Fracture of ulnar styloid, left, closed, initial encounter  Contusion of knee and lower leg, left, initial encounter  Contusion of hand, right, initial encounter  Traumatic hematoma of forehead, initial encounter      Victorino Dike, FNP 09/29/14 1251  Lavonia Drafts, MD 09/29/14 1627

## 2014-10-12 ENCOUNTER — Encounter: Payer: Self-pay | Admitting: General Surgery

## 2014-10-20 ENCOUNTER — Encounter: Payer: Self-pay | Admitting: General Surgery

## 2014-10-20 ENCOUNTER — Ambulatory Visit (INDEPENDENT_AMBULATORY_CARE_PROVIDER_SITE_OTHER): Payer: 59 | Admitting: General Surgery

## 2014-10-20 ENCOUNTER — Telehealth: Payer: Self-pay | Admitting: General Surgery

## 2014-10-20 VITALS — BP 124/70 | HR 70 | Resp 12 | Ht 64.0 in | Wt 201.0 lb

## 2014-10-20 DIAGNOSIS — Z853 Personal history of malignant neoplasm of breast: Secondary | ICD-10-CM | POA: Diagnosis not present

## 2014-10-20 NOTE — Patient Instructions (Addendum)
The patient has been asked to return to the office in six months with a bilateral breast diagnostic mammogram. Continue self breast exams. Call office for any new breast issues or concerns.

## 2014-10-20 NOTE — Progress Notes (Signed)
Patient ID: Erica Tucker. [place], female   DOB: [birth year], 59 y.o.   MRN: [id number]  Chief Complaint  Patient presents with  . Follow-up    mammogram    HPI Erica Tucker. Erica Tucker is a 59 y.o. female.  who presents for a breast cancer follow up. The most recent mammogram was done on 10-09-14 .  Patient does perform regular self breast checks and gets regular mammograms done.  No new breast issues.  HPI  Past Medical History  Diagnosis Date  . Carpal tunnel syndrome 2012  . Hypertension   . Personal history of tobacco use, presenting hazards to health   . Asthma   . Diffuse cystic mastopathy   . 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 Partial mastectomy & radiation. Currently on anastrozole  . Abdominal pain, right upper quadrant 2012  . Mammographic microcalcification 2012  . Special screening for malignant neoplasms, colon 2013  . Obesity, unspecified 2013  . Cancer     right breast  . Hyperlipidemia   . Glaucoma   . Arthritis   . Osteoporosis     Past Surgical History  Procedure Laterality Date  . Colonoscopy  [id number]    Dr. Sonny Masters, [facility]; Dr Lacie Scotts  . Upper gi endoscopy  2009  . Breast cyst excision  2005  . Pilonidal cyst excision  1979  . Breast mammosite Right May 2012  . Breast lumpectomy with sentinel lymph node biopsy Right 2012  . Liver biopsy  2013    No family history on file.  Social History History  Substance Use Topics  . Smoking status: Former Smoker -- 1.00 packs/day for 20 years    Types: Cigarettes  . Smokeless tobacco: Not on file  . Alcohol Use: No     Comment: quit    Allergies  Allergen Reactions  . Epinephrine Other (See Comments)    Chest pain  . Sulfa Antibiotics Other (See Comments)    Reaction listed as unknown    Current Outpatient Prescriptions  Medication Sig Dispense Refill  . acetaminophen (TYLENOL) 325 MG tablet Take 650 mg by mouth every 6  (six) hours as needed for pain.    Marland Kitchen albuterol (PROVENTIL HFA;VENTOLIN HFA) 108 (90 BASE) MCG/ACT inhaler Inhale 2 puffs into the lungs every 6 (six) hours as needed for wheezing.    Marland Kitchen alendronate (FOSAMAX) 70 MG tablet Take 70 mg by mouth once a week. Take with a full glass of water on an empty stomach.    Marland Kitchen amitriptyline (ELAVIL) 50 MG tablet Take 50 mg by mouth at bedtime.    Marland Kitchen anastrozole (ARIMIDEX) 1 MG tablet Take 1 mg by mouth daily.    . bisoprolol-hydrochlorothiazide (ZIAC) 2.5-6.25 MG per tablet Take 1 tablet by mouth daily.    . clonazePAM (KLONOPIN) 0.5 MG tablet Take 0.5 mg by mouth 2 (two) times daily as needed for anxiety.     Marland Kitchen escitalopram (LEXAPRO) 20 MG tablet Take 20 mg by mouth daily.    . fluticasone (FLONASE) 50 MCG/ACT nasal spray Place 2 sprays into both nostrils daily.    . Fluticasone-Salmeterol (ADVAIR HFA IN) Inhale into the lungs.    Marland Kitchen HYDROcodone-acetaminophen (NORCO/VICODIN) 5-325 MG per tablet Take 1 tablet by mouth every 4 (four) hours as needed for moderate pain. 12 tablet 0  . omeprazole (PRILOSEC) 20 MG capsule Take 1 capsule by mouth 2 (two) times daily.    Marland Kitchen  traMADol (ULTRAM) 50 MG tablet Take 1 tablet by mouth daily.     No current facility-administered medications for this visit.    Review of Systems Review of Systems  Constitutional: Negative.   Respiratory: Negative.   Cardiovascular: Negative.     Blood pressure 124/70, pulse 70, resp. rate 12, height 5\' 4"  (1.626 m), weight 201 lb (91.173 kg).  Physical Exam Physical Exam  Constitutional: She is oriented to person, place, and time. She appears well-developed and well-nourished.  HENT:  Mouth/Throat: Oropharynx is clear and moist.  Eyes: Conjunctivae are normal. No scleral icterus.  Neck: Neck supple.  Cardiovascular: Normal rate, regular rhythm and normal heart sounds.   Pulmonary/Chest: Effort normal and breath sounds normal. Right breast exhibits no inverted nipple, no mass, no nipple  discharge, no skin change and no tenderness. Left breast exhibits no inverted nipple, no mass, no nipple discharge, no skin change and no tenderness.    Abdominal: Soft. Normal appearance and bowel sounds are normal. There is no hepatomegaly. There is no tenderness.  Lymphadenopathy:    She has no cervical adenopathy.    She has no axillary adenopathy.  Neurological: She is alert and oriented to person, place, and time.  Skin: Skin is warm and dry.  Psychiatric: She has a normal mood and affect.    Data Reviewed mammogram reviewed and [place] reviewed. Assymetry in the right axilla and left breast superior central location. [place] of these two areas are normal. Likely these are benign findings.   Assessment    Ca of right breast 4 years from lumpectomy and radiation.  Currently on Arimidex.     Plan    The patient has been asked to return to the office in six months with a bilateral breast diagnostic mammogram.      PCP:  Doneen Poisson 10/20/2014, 3:27 PM

## 2014-10-21 NOTE — Telephone Encounter (Signed)
ERROR

## 2015-03-23 ENCOUNTER — Encounter: Payer: Self-pay | Admitting: General Surgery

## 2015-03-25 ENCOUNTER — Encounter: Payer: Self-pay | Admitting: General Surgery

## 2015-03-25 ENCOUNTER — Ambulatory Visit (INDEPENDENT_AMBULATORY_CARE_PROVIDER_SITE_OTHER): Payer: 59 | Admitting: General Surgery

## 2015-03-25 VITALS — BP 128/78 | HR 74 | Resp 12 | Ht 64.0 in | Wt 198.0 lb

## 2015-03-25 DIAGNOSIS — Z853 Personal history of malignant neoplasm of breast: Secondary | ICD-10-CM | POA: Diagnosis not present

## 2015-03-25 NOTE — Patient Instructions (Signed)
The patient has been asked to return to the office in one year with a bilateral diagnostic mammogram. 

## 2015-03-25 NOTE — Progress Notes (Signed)
Patient ID: Erica Tucker, female   DOB: 1956/01/16, 59 y.o.   MRN: [id number]  Chief Complaint  Patient presents with  . Follow-up    mammogram    HPI Erica Tucker is a 59 y.o. female who presents for a breast cancer follow up. The most recent mammogram was done on 03/19/15.  Patient does perform regular self breast checks and gets regular mammograms done.   I have reviewed the history of present illness with the patient. HPI  Past Medical History  Diagnosis Date  . Carpal tunnel syndrome 2012  . Hypertension   . Personal history of tobacco use, presenting hazards to health   . Asthma   . Diffuse cystic mastopathy   . 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 Partial mastectomy & radiation. Currently on anastrozole  . Abdominal pain, right upper quadrant 2012  . Mammographic microcalcification 2012  . Special screening for malignant neoplasms, colon 2013  . Obesity, unspecified 2013  . Cancer ([place])     right breast  . Hyperlipidemia   . Glaucoma   . Arthritis   . Osteoporosis     Past Surgical History  Procedure Laterality Date  . Colonoscopy  [id number]    Dr. Sonny Masters, [facility]; Dr Lacie Scotts  . Upper gi endoscopy  2009  . Breast cyst excision  2005  . Pilonidal cyst excision  1979  . Breast mammosite Right May 2012  . Breast lumpectomy with sentinel lymph node biopsy Right 2012  . Liver biopsy  2013    History reviewed. No pertinent family history.  Social History Social History  Substance Use Topics  . Smoking status: Former Smoker -- 1.00 packs/day for 20 years    Types: Cigarettes  . Smokeless tobacco: None  . Alcohol Use: No     Comment: quit    Allergies  Allergen Reactions  . Epinephrine Other (See Comments)    Chest pain  . Sulfa Antibiotics Other (See Comments)    Reaction listed as unknown    Current Outpatient Prescriptions  Medication Sig Dispense Refill  .  acetaminophen (TYLENOL) 325 MG tablet Take 650 mg by mouth every 6 (six) hours as needed for pain.    Marland Kitchen albuterol (PROVENTIL HFA;VENTOLIN HFA) 108 (90 BASE) MCG/ACT inhaler Inhale 2 puffs into the lungs every 6 (six) hours as needed for wheezing.    Marland Kitchen alendronate (FOSAMAX) 70 MG tablet Take 70 mg by mouth once a week. Take with a full glass of water on an empty stomach.    Marland Kitchen amitriptyline (ELAVIL) 50 MG tablet Take 50 mg by mouth at bedtime.    Marland Kitchen anastrozole (ARIMIDEX) 1 MG tablet Take 1 mg by mouth daily.    . bisoprolol-hydrochlorothiazide (ZIAC) 2.5-6.25 MG per tablet Take 1 tablet by mouth daily.    . clonazePAM (KLONOPIN) 0.5 MG tablet Take 0.5 mg by mouth 2 (two) times daily as needed for anxiety.     Marland Kitchen escitalopram (LEXAPRO) 20 MG tablet Take 20 mg by mouth daily.    . fluticasone (FLONASE) 50 MCG/ACT nasal spray Place 2 sprays into both nostrils daily.    . Fluticasone-Salmeterol (ADVAIR HFA IN) Inhale into the lungs.    Marland Kitchen omeprazole (PRILOSEC) 20 MG capsule Take 1 capsule by mouth 2 (two) times daily.    . traMADol (ULTRAM) 50 MG tablet Take 1 tablet by mouth daily.     No current  facility-administered medications for this visit.    Review of Systems Review of Systems  Constitutional: Negative.   Respiratory: Negative.   Cardiovascular: Negative.     Blood pressure 128/78, pulse 74, resp. rate 12, height 5\' 4"  (1.626 m), weight 198 lb (89.812 kg).  Physical Exam Physical Exam  Constitutional: She is oriented to person, place, and time. She appears well-developed and well-nourished.  Eyes: Conjunctivae are normal. No scleral icterus.  Neck: Neck supple.  Cardiovascular: Normal rate, regular rhythm and normal heart sounds.   Pulmonary/Chest: Effort normal and breath sounds normal. Right breast exhibits no inverted nipple, no mass, no nipple discharge, no skin change and no tenderness. Left breast exhibits no inverted nipple, no mass, no nipple discharge, no skin change and no  tenderness.    Abdominal: Soft. Bowel sounds are normal. There is no hepatomegaly. No hernia.  Lymphadenopathy:    She has no cervical adenopathy.    She has no axillary adenopathy.  Neurological: She is alert and oriented to person, place, and time.  Skin: Skin is warm and dry.    Data Reviewed Mammogram reviewed-cat 2  Assessment    Stable exam. Now in 5th yr post right breast lumpectomy/radiation. Ow on Arimidex. Doing well.    Plan    The patient has been asked to return to the office in one year with a bilateral diagnostic mammogram. PCP:  Clayborn Bigness  This information has been scribed by Gaspar Cola CMA.        Margalit Leece G 03/25/2015, 11:53 AM

## 2016-01-19 ENCOUNTER — Other Ambulatory Visit: Payer: Self-pay

## 2016-01-19 DIAGNOSIS — C50911 Malignant neoplasm of unspecified site of right female breast: Secondary | ICD-10-CM

## 2016-01-26 ENCOUNTER — Other Ambulatory Visit: Payer: Self-pay | Admitting: *Deleted

## 2016-01-26 ENCOUNTER — Ambulatory Visit
Admission: RE | Admit: 2016-01-26 | Discharge: 2016-01-26 | Disposition: A | Discharge status: Home / Self Care | Payer: Self-pay | Source: Ambulatory Visit | Attending: *Deleted | Admitting: *Deleted

## 2016-01-26 DIAGNOSIS — Z9289 Personal history of other medical treatment: Secondary | ICD-10-CM

## 2016-03-14 ENCOUNTER — Encounter: Payer: Self-pay | Admitting: *Deleted

## 2016-03-22 ENCOUNTER — Ambulatory Visit
Admission: RE | Admit: 2016-03-22 | Discharge: 2016-03-22 | Disposition: A | Discharge status: Home / Self Care | Payer: BLUE CROSS/BLUE SHIELD | Source: Ambulatory Visit | Attending: General Surgery | Admitting: General Surgery

## 2016-03-22 ENCOUNTER — Encounter: Payer: Self-pay | Admitting: General Surgery

## 2016-03-22 ENCOUNTER — Ambulatory Visit (INDEPENDENT_AMBULATORY_CARE_PROVIDER_SITE_OTHER): Payer: BLUE CROSS/BLUE SHIELD | Admitting: General Surgery

## 2016-03-22 VITALS — BP 116/58 | HR 84 | Resp 16 | Ht 64.0 in | Wt 216.0 lb

## 2016-03-22 DIAGNOSIS — C50911 Malignant neoplasm of unspecified site of right female breast: Secondary | ICD-10-CM | POA: Diagnosis present

## 2016-03-22 DIAGNOSIS — Z853 Personal history of malignant neoplasm of breast: Secondary | ICD-10-CM | POA: Diagnosis not present

## 2016-03-22 HISTORY — DX: Personal history of irradiation: Z92.3

## 2016-03-22 NOTE — Progress Notes (Signed)
Patient ID: Erica Tucker. Erica Tucker, female   DOB: 1955-07-17, 60 y.o.   MRN: [id number]  Chief Complaint  Patient presents with  . Follow-up    mammogram    HPI Erica Tucker. [name] is a 60 y.o. female.  who presents for follow up breast cancer and a breast evaluation. The most recent mammogram was done on 03-22-16.  Patient does perform regular self breast checks and gets regular mammograms done.  No new breast issues. She does have history of osteopenia but has not had a bone density done in few yrs. I have reviewed the history of present illness with the patient.   HPI  Past Medical History:  Diagnosis Date  . Abdominal pain, right upper quadrant 2012  . Arthritis   . Asthma   . Cancer Erica Tucker) 2012   right breast  . Carpal tunnel syndrome 2012  . Diffuse cystic mastopathy   . Glaucoma   . Hyperlipidemia   . Hypertension   . Mammographic microcalcification 2012  . Obesity, unspecified 2013  . Osteoporosis   . 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 Partial mastectomy & radiation. Currently on anastrozole  . Personal history of radiation therapy   . Personal history of tobacco use, presenting hazards to health   . Special screening for malignant neoplasms, colon 2013    Past Surgical History:  Procedure Laterality Date  . BREAST BIOPSY Right 2012   +  . BREAST CYST EXCISION  2005  . BREAST LUMPECTOMY WITH SENTINEL LYMPH NODE BIOPSY Right 2012  . BREAST MAMMOSITE Right May 2012  . COLONOSCOPY  [id number]   Dr. Sonny Masters, [facility]; Dr Lacie Scotts  . LIVER BIOPSY  2013  . PILONIDAL CYST EXCISION  1979  . UPPER GI ENDOSCOPY  2009    Family History  Problem Relation Age of Onset  . Breast cancer Neg Hx     Social History Social History  Substance Use Topics  . Smoking status: Former Smoker    Packs/day: 1.00    Years: 20.00    Types: Cigarettes  . Smokeless tobacco: Not on file  . Alcohol use No   Comment: quit    Allergies  Allergen Reactions  . Epinephrine Other (See Comments)    Chest pain  . Sulfa Antibiotics Other (See Comments)    Reaction listed as unknown    Current Outpatient Prescriptions  Medication Sig Dispense Refill  . acetaminophen (TYLENOL) 325 MG tablet Take 650 mg by mouth every 6 (six) hours as needed for pain.    Marland Kitchen albuterol (PROVENTIL HFA;VENTOLIN HFA) 108 (90 BASE) MCG/ACT inhaler Inhale 2 puffs into the lungs every 6 (six) hours as needed for wheezing.    Marland Kitchen alendronate (FOSAMAX) 70 MG tablet Take 70 mg by mouth once a week. Take with a full glass of water on an empty stomach.    Marland Kitchen amitriptyline (ELAVIL) 50 MG tablet Take 50 mg by mouth at bedtime.    Marland Kitchen anastrozole (ARIMIDEX) 1 MG tablet Take 1 mg by mouth daily.    . bisoprolol-hydrochlorothiazide (ZIAC) 2.5-6.25 MG per tablet Take 1 tablet by mouth daily.    . clonazePAM (KLONOPIN) 0.5 MG tablet Take 0.5 mg by mouth 2 (two) times daily as needed for anxiety.     Marland Kitchen escitalopram (LEXAPRO) 20 MG tablet Take 20 mg by mouth daily.    . fluticasone (FLONASE) 50 MCG/ACT nasal spray Place 2 sprays into  both nostrils daily.    . Fluticasone-Salmeterol (ADVAIR HFA IN) Inhale into the lungs.    Marland Kitchen omeprazole (PRILOSEC) 20 MG capsule Take 1 capsule by mouth 2 (two) times daily.    . traMADol (ULTRAM) 50 MG tablet Take 1 tablet by mouth daily.     No current facility-administered medications for this visit.     Review of Systems Review of Systems  Constitutional: Negative.   Respiratory: Negative.   Cardiovascular: Negative.     Blood pressure (!) 116/58, pulse 84, resp. rate 16, height 5\' 4"  (1.626 m), weight 216 lb (98 kg).  Physical Exam Physical Exam  Constitutional: She is oriented to person, place, and time. She appears well-developed and well-nourished.  HENT:  Mouth/Throat: Oropharynx is clear and moist.  Eyes: Conjunctivae are normal. No scleral icterus.  Neck: Neck supple.  Cardiovascular:  Normal rate, regular rhythm and normal heart sounds.   Pulmonary/Chest: Effort normal and breath sounds normal. Right breast exhibits no inverted nipple, no mass, no nipple discharge, no skin change and no tenderness. Left breast exhibits no inverted nipple, no mass, no nipple discharge, no skin change and no tenderness.    Abdominal: Soft. Normal appearance. There is no hepatomegaly. There is no tenderness.  Lymphadenopathy:    She has no cervical adenopathy.    She has no axillary adenopathy.  Neurological: She is alert and oriented to person, place, and time.  Skin: Skin is warm and dry.  Psychiatric: Her behavior is normal.    Data Reviewed  Mammogram reviewed.  Assessment    Ca of right breast 5 years from lumpectomy and radiation.  Currently on Arimidex    Plan    The patient has been asked to return to the office in one year with a bilateral breast diagnostic mammogram.  Discussed continuing anastrozole and will investigate feasibility of getting  Breast Cancer Index done with her insurance.      This information has been scribed by Karie Fetch RN, BSN,BC.  SANKAR,SEEPLAPUTHUR G 03/22/2016, 1:30 PM

## 2016-03-22 NOTE — Patient Instructions (Addendum)
The patient is aware to call back for any questions or concerns. The patient has been asked to return to the office in one year with a bilateral breast diagnostic mammogram

## 2016-03-29 ENCOUNTER — Telehealth: Payer: Self-pay | Admitting: *Deleted

## 2016-03-29 NOTE — Telephone Encounter (Signed)
David from [employer] called you back. You was in a room so he stated to just call him back when you get a chance [phone]. He states best to order test and they will call pt and let her know how much testing is and decide from there.

## 2016-03-30 NOTE — Telephone Encounter (Signed)
Patient was returning your call

## 2016-03-30 NOTE — Telephone Encounter (Signed)
She will call back if she decides to have the test done. She is undecided at this point.

## 2016-08-10 ENCOUNTER — Telehealth: Payer: Self-pay

## 2016-08-10 NOTE — Telephone Encounter (Signed)
Patient called and recently had a bone scan done where she now resides near [place]. She reports that she has shrunk 1.5 inches, this is new as she has not had any shrinkage previously. She states that her T score was -3.0. She is concerned about continuing the Arimidex. She would like to know if she should stop this or not in light of her bone scan results.

## 2016-08-11 ENCOUNTER — Telehealth: Payer: Self-pay

## 2016-08-11 NOTE — Telephone Encounter (Signed)
Spoke with patient about her Arimidex. I let her know that Dr Jamal Collin could not say weather or not she should continue this. He wants her to speak with Oncology about this. She will speak with her GYN doctor to see about setting up with oncology where she is living now. She is very appreciative for all we have done for her.

## 2017-01-17 ENCOUNTER — Other Ambulatory Visit: Payer: Self-pay

## 2017-01-17 DIAGNOSIS — Z853 Personal history of malignant neoplasm of breast: Secondary | ICD-10-CM

## 2017-03-22 ENCOUNTER — Ambulatory Visit
Admission: RE | Admit: 2017-03-22 | Discharge: 2017-03-22 | Disposition: A | Discharge status: Home / Self Care | Payer: PRIVATE HEALTH INSURANCE | Source: Ambulatory Visit | Attending: General Surgery | Admitting: General Surgery

## 2017-03-22 ENCOUNTER — Encounter: Payer: Self-pay | Admitting: General Surgery

## 2017-03-22 ENCOUNTER — Ambulatory Visit (INDEPENDENT_AMBULATORY_CARE_PROVIDER_SITE_OTHER): Payer: PRIVATE HEALTH INSURANCE | Admitting: General Surgery

## 2017-03-22 VITALS — BP 132/80 | HR 90 | Resp 16 | Ht 64.0 in | Wt 223.0 lb

## 2017-03-22 DIAGNOSIS — Z853 Personal history of malignant neoplasm of breast: Secondary | ICD-10-CM

## 2017-03-22 NOTE — Progress Notes (Signed)
Patient ID: Erica Tucker. [place], female   DOB: [birth year], 61 y.o.   MRN: [id number]  Chief Complaint  Patient presents with  . Follow-up    HPI Erica Tucker is a 61 y.o. female.  who presents for follow up breast cancer and a breast evaluation. The most recent mammogram was done on 03-22-17.  Patient does perform regular self breast checks and gets regular mammograms done.  No new breast issues. She stopped the Arimidex back in June.   HPI  Past Medical History:  Diagnosis Date  . Abdominal pain, right upper quadrant 2012  . Arthritis   . Asthma   . Cancer [facility]) 2012   right breast  . Carpal tunnel syndrome 2012  . Diffuse cystic mastopathy   . Glaucoma   . Hyperlipidemia   . Hypertension   . Mammographic microcalcification 2012  . Obesity, unspecified 2013  . Osteoporosis   . 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 Partial mastectomy & radiation. Currently on anastrozole  . Personal history of radiation therapy   . Personal history of tobacco use, presenting hazards to health   . Special screening for malignant neoplasms, colon 2013    Past Surgical History:  Procedure Laterality Date  . BREAST BIOPSY Right 2012   +  . BREAST CYST EXCISION  2005  . BREAST LUMPECTOMY Right 2013   DUCTAL CARCINOMA IN SITU (DCIS).  Marland Kitchen BREAST LUMPECTOMY WITH SENTINEL LYMPH NODE BIOPSY Right 2012  . BREAST MAMMOSITE Right May 2012  . COLONOSCOPY  [id number]   Dr. Sonny Masters, [facility]; Dr Lacie Scotts  . LIVER BIOPSY  2013  . PILONIDAL CYST EXCISION  1979  . UPPER GI ENDOSCOPY  2009    Family History  Problem Relation Age of Onset  . Breast cancer Neg Hx     Social History Social History   Tobacco Use  . Smoking status: Former Smoker    Packs/day: 1.00    Years: 20.00    Pack years: 20.00    Types: Cigarettes  . Smokeless tobacco: Never Used  Substance Use Topics  . Alcohol use: No    Comment: quit  .  Drug use: No    Allergies  Allergen Reactions  . Epinephrine Other (See Comments)    Chest pain  . Sulfa Antibiotics Other (See Comments)    Reaction listed as unknown    Current Outpatient Medications  Medication Sig Dispense Refill  . acetaminophen (TYLENOL) 325 MG tablet Take 650 mg by mouth every 6 (six) hours as needed for pain.    Marland Kitchen albuterol (PROVENTIL HFA;VENTOLIN HFA) 108 (90 BASE) MCG/ACT inhaler Inhale 2 puffs into the lungs every 6 (six) hours as needed for wheezing.    Marland Kitchen alendronate (FOSAMAX) 70 MG tablet Take 70 mg by mouth once a week. Take with a full glass of water on an empty stomach.    Marland Kitchen amitriptyline (ELAVIL) 50 MG tablet Take 50 mg by mouth at bedtime.    . clonazePAM (KLONOPIN) 0.5 MG tablet Take 0.5 mg by mouth 2 (two) times daily as needed for anxiety.     Marland Kitchen escitalopram (LEXAPRO) 20 MG tablet Take 20 mg by mouth daily.    . fluticasone (FLONASE) 50 MCG/ACT nasal spray Place 2 sprays into both nostrils daily.    . Fluticasone-Salmeterol (ADVAIR HFA IN) Inhale into the lungs.    Marland Kitchen losartan (COZAAR) 50 MG tablet Take 50 mg  by mouth daily.    Marland Kitchen omeprazole (PRILOSEC) 20 MG capsule Take 1 capsule by mouth 2 (two) times daily.     No current facility-administered medications for this visit.     Review of Systems Review of Systems  Constitutional: Negative.   Respiratory: Negative.   Cardiovascular: Negative.     Blood pressure 132/80, pulse 90, resp. rate 16, height 5\' 4"  (1.626 m), weight 223 lb (101.2 kg).  Physical Exam Physical Exam  Constitutional: She is oriented to person, place, and time. She appears well-developed and well-nourished.  HENT:  Mouth/Throat: Oropharynx is clear and moist.  Eyes: Conjunctivae are normal. No scleral icterus.  Neck: Neck supple.  Cardiovascular: Normal rate, regular rhythm and normal heart sounds.  Pulmonary/Chest: Effort normal and breath sounds normal. No respiratory distress. Right breast exhibits no inverted  nipple, no mass, no nipple discharge, no skin change and no tenderness. Left breast exhibits no inverted nipple, no mass, no nipple discharge, no skin change and no tenderness.    Lymphadenopathy:    She has no cervical adenopathy.    She has no axillary adenopathy.  Neurological: She is alert and oriented to person, place, and time.  Skin: Skin is warm and dry.  Psychiatric: Her behavior is normal.    Data Reviewed  Mammogram reviewed and stable.  Assessment    Ca of right breast 6 years from lumpectomy and radiation.  Completed 6 years Anastrozole. Exam is stable    Plan    The patient has been asked to return to the office to see Dr Bary Castilla in one year with a bilateral diagnostic mammogram.      HPI, Physical Exam, Assessment and Plan have been scribed under the direction and in the presence of Mckinley Jewel, MD Karie Fetch, RN  I have completed the exam and reviewed the above documentation for accuracy and completeness.  I agree with the above.  Haematologist has been used and any errors in dictation or transcription are unintentional.  Caedin Mogan G. Jamal Collin, M.D., F.A.C.S.  [name] Darnell Level 03/22/2017, 3:21 PM

## 2017-03-22 NOTE — Patient Instructions (Signed)
The patient is aware to call back for any questions or concerns.  

## 2018-01-14 ENCOUNTER — Other Ambulatory Visit: Payer: Self-pay

## 2018-01-14 DIAGNOSIS — C50911 Malignant neoplasm of unspecified site of right female breast: Secondary | ICD-10-CM

## 2018-03-26 ENCOUNTER — Other Ambulatory Visit: Payer: PRIVATE HEALTH INSURANCE

## 2018-04-02 ENCOUNTER — Ambulatory Visit: Payer: PRIVATE HEALTH INSURANCE | Admitting: General Surgery
